# Patient Record
Sex: Female | Born: 1945 | Race: Black or African American | Hispanic: No | Marital: Married | Smoking: Never smoker
Health system: Southern US, Community
[De-identification: ages and names within clinical notes are randomized; demographics above are authoritative.]

## PROBLEM LIST (undated history)

## (undated) DIAGNOSIS — I10 Essential (primary) hypertension: Secondary | ICD-10-CM

## (undated) DIAGNOSIS — IMO0002 Reserved for concepts with insufficient information to code with codable children: Secondary | ICD-10-CM

## (undated) DIAGNOSIS — I4891 Unspecified atrial fibrillation: Secondary | ICD-10-CM

## (undated) DIAGNOSIS — M109 Gout, unspecified: Secondary | ICD-10-CM

## (undated) DIAGNOSIS — M1990 Unspecified osteoarthritis, unspecified site: Secondary | ICD-10-CM

## (undated) DIAGNOSIS — E119 Type 2 diabetes mellitus without complications: Secondary | ICD-10-CM

## (undated) DIAGNOSIS — M329 Systemic lupus erythematosus, unspecified: Secondary | ICD-10-CM

## (undated) HISTORY — PX: LUNG SURGERY: SHX703

## (undated) HISTORY — PX: CHOLECYSTECTOMY: SHX55

## (undated) HISTORY — PX: ABDOMINAL HYSTERECTOMY: SHX81

---

## 2014-12-30 ENCOUNTER — Emergency Department (HOSPITAL_BASED_OUTPATIENT_CLINIC_OR_DEPARTMENT_OTHER)
Admission: EM | Admit: 2014-12-30 | Discharge: 2014-12-31 | Disposition: A | Discharge status: Home / Self Care | Payer: Medicare PPO | Attending: Emergency Medicine | Admitting: Emergency Medicine

## 2014-12-30 ENCOUNTER — Encounter (HOSPITAL_BASED_OUTPATIENT_CLINIC_OR_DEPARTMENT_OTHER): Payer: Self-pay | Admitting: *Deleted

## 2014-12-30 DIAGNOSIS — E119 Type 2 diabetes mellitus without complications: Secondary | ICD-10-CM | POA: Diagnosis not present

## 2014-12-30 DIAGNOSIS — R61 Generalized hyperhidrosis: Secondary | ICD-10-CM | POA: Insufficient documentation

## 2014-12-30 DIAGNOSIS — R197 Diarrhea, unspecified: Secondary | ICD-10-CM | POA: Diagnosis not present

## 2014-12-30 DIAGNOSIS — Z7952 Long term (current) use of systemic steroids: Secondary | ICD-10-CM | POA: Insufficient documentation

## 2014-12-30 DIAGNOSIS — Z7982 Long term (current) use of aspirin: Secondary | ICD-10-CM | POA: Insufficient documentation

## 2014-12-30 DIAGNOSIS — I4891 Unspecified atrial fibrillation: Secondary | ICD-10-CM | POA: Insufficient documentation

## 2014-12-30 DIAGNOSIS — R1110 Vomiting, unspecified: Secondary | ICD-10-CM | POA: Insufficient documentation

## 2014-12-30 DIAGNOSIS — R42 Dizziness and giddiness: Secondary | ICD-10-CM | POA: Diagnosis present

## 2014-12-30 DIAGNOSIS — I10 Essential (primary) hypertension: Secondary | ICD-10-CM | POA: Insufficient documentation

## 2014-12-30 DIAGNOSIS — R531 Weakness: Secondary | ICD-10-CM | POA: Diagnosis not present

## 2014-12-30 DIAGNOSIS — M109 Gout, unspecified: Secondary | ICD-10-CM | POA: Insufficient documentation

## 2014-12-30 DIAGNOSIS — Z79899 Other long term (current) drug therapy: Secondary | ICD-10-CM | POA: Insufficient documentation

## 2014-12-30 DIAGNOSIS — M1990 Unspecified osteoarthritis, unspecified site: Secondary | ICD-10-CM | POA: Insufficient documentation

## 2014-12-30 HISTORY — DX: Essential (primary) hypertension: I10

## 2014-12-30 HISTORY — DX: Gout, unspecified: M10.9

## 2014-12-30 HISTORY — DX: Type 2 diabetes mellitus without complications: E11.9

## 2014-12-30 HISTORY — DX: Unspecified osteoarthritis, unspecified site: M19.90

## 2014-12-30 HISTORY — DX: Unspecified atrial fibrillation: I48.91

## 2014-12-30 LAB — URINALYSIS, ROUTINE W REFLEX MICROSCOPIC
Bilirubin Urine: NEGATIVE
Glucose, UA: NEGATIVE mg/dL
Hgb urine dipstick: NEGATIVE
Ketones, ur: NEGATIVE mg/dL
NITRITE: NEGATIVE
PROTEIN: NEGATIVE mg/dL
Specific Gravity, Urine: 1.011 (ref 1.005–1.030)
Urobilinogen, UA: 0.2 mg/dL (ref 0.0–1.0)
pH: 5.5 (ref 5.0–8.0)

## 2014-12-30 LAB — URINE MICROSCOPIC-ADD ON

## 2014-12-30 LAB — COMPREHENSIVE METABOLIC PANEL
ALT: 19 U/L (ref 0–35)
ANION GAP: 7 (ref 5–15)
AST: 19 U/L (ref 0–37)
Albumin: 3.5 g/dL (ref 3.5–5.2)
Alkaline Phosphatase: 58 U/L (ref 39–117)
BUN: 42 mg/dL — ABNORMAL HIGH (ref 6–23)
CO2: 27 mmol/L (ref 19–32)
CREATININE: 1.55 mg/dL — AB (ref 0.50–1.10)
Calcium: 8.6 mg/dL (ref 8.4–10.5)
Chloride: 103 mmol/L (ref 96–112)
GFR, EST AFRICAN AMERICAN: 39 mL/min — AB (ref >90)
GFR, EST NON AFRICAN AMERICAN: 33 mL/min — AB (ref >90)
GLUCOSE: 162 mg/dL — AB (ref 70–99)
Potassium: 3.4 mmol/L — ABNORMAL LOW (ref 3.5–5.1)
Sodium: 137 mmol/L (ref 135–145)
Total Bilirubin: 0.2 mg/dL — ABNORMAL LOW (ref 0.3–1.2)
Total Protein: 7.8 g/dL (ref 6.0–8.3)

## 2014-12-30 LAB — CBC
HEMATOCRIT: 33.7 % — AB (ref 36.0–46.0)
HEMOGLOBIN: 10.9 g/dL — AB (ref 12.0–15.0)
MCH: 26.3 pg (ref 26.0–34.0)
MCHC: 32.3 g/dL (ref 30.0–36.0)
MCV: 81.4 fL (ref 78.0–100.0)
Platelets: 234 10*3/uL (ref 150–400)
RBC: 4.14 MIL/uL (ref 3.87–5.11)
RDW: 15 % (ref 11.5–15.5)
WBC: 8.2 10*3/uL (ref 4.0–10.5)

## 2014-12-30 LAB — TROPONIN I: Troponin I: <0.03 ng/mL (ref <0.031)

## 2014-12-30 MED ORDER — SODIUM CHLORIDE 0.9 % IV BOLUS (SEPSIS)
1000.0000 mL | Freq: Once | INTRAVENOUS | Status: AC
  Administered 2014-12-30: 1000 mL via INTRAVENOUS

## 2014-12-30 MED ORDER — PROMETHAZINE HCL 25 MG PO TABS
25.0000 mg | ORAL_TABLET | Freq: Four times a day (QID) | ORAL | Status: DC | PRN
Start: 2014-12-30 — End: 2019-09-07

## 2014-12-30 NOTE — ED Notes (Signed)
Pt sts she was in a store 5 days ago when she became dizzy and sweaty. When she got home that day, she began vomiting. Symptoms have been persistent since then.

## 2014-12-30 NOTE — ED Notes (Signed)
MD at bedside. 

## 2014-12-30 NOTE — ED Provider Notes (Signed)
CSN: [id number]     Arrival date & time 12/30/14  2030 History  This chart was scribed for Jerelyn ScottMartha Linker, MD by Roxy Cedarhandni Bhalodia, ED Scribe. This patient was seen in room MH02/MH02 and the patient's care was started at 8:55 PM.   Chief Complaint  Patient presents with  . Dizziness   Patient is a 69 y.o. female presenting with dizziness and vomiting. The history is provided by the patient. No language interpreter was used.  Dizziness Severity:  Moderate Duration:  5 days Timing:  Intermittent Progression:  Waxing and waning Chronicity:  New Relieved by:  Nothing Worsened by:  Nothing Ineffective treatments:  None tried Associated symptoms: diarrhea and vomiting   Emesis Severity:  Moderate Timing:  Intermittent Associated symptoms: diarrhea    HPI Comments: Jody Burton is a 69 y.o. female with a PMHx of atrial fibrillation, arthritis, hypertension, diabetes, gout, abdominal hysterectomy, cholecystectomy, and lung surgery, who presents to the Emergency Department complaining of moderate dizziness and diaphoresis that initially began 5 days ago. Patient states that she was out at a store during onset of symptoms. She states that she had associated onset of vomiting and diarrhea upon returning home from the store. She states that her symptoms have been persistent since onset. She also reports generalized weakness upon ambulation. Patient states that the vomiting has resolved and she denies emesis today. Patient states that drinking water and ice chips in the past few days caused abdominal pain and nausea. She denies trouble drinking water or abdominal pain today. She denies associated CP, SOB, syncope. Patient states that she is currently in town caring for her daughter who has severe lupus. Patient states that she was hoping that her symptoms would improve but came into the ED because her symptoms have been persistent since onset.  Past Medical History  Diagnosis Date  . Atrial  fibrillation   . Arthritis   . Hypertension   . Diabetes mellitus without complication   . Gout    Past Surgical History  Procedure Laterality Date  . Abdominal hysterectomy    . Cholecystectomy    . Lung surgery     No family history on file. History  Substance Use Topics  . Smoking status: Never Smoker   . Smokeless tobacco: Not on file  . Alcohol Use: No   OB History    No data available     Review of Systems  Constitutional: Positive for diaphoresis.  Gastrointestinal: Positive for vomiting and diarrhea.  Neurological: Positive for dizziness.  All other systems reviewed and are negative.  Allergies  Review of patient's allergies indicates no known allergies.  Home Medications   Prior to Admission medications   Medication Sig Start Date End Date Taking? Authorizing Provider  allopurinol (ZYLOPRIM) 100 MG tablet Take 100 mg by mouth daily.   Yes Historical Provider, MD  aspirin 81 MG tablet Take 81 mg by mouth daily.   Yes Historical Provider, MD  carvedilol (COREG) 3.125 MG tablet Take 3.125 mg by mouth 2 (two) times daily with a meal.   Yes Historical Provider, MD  furosemide (LASIX) 20 MG tablet Take 20 mg by mouth.   Yes Historical Provider, MD  glimepiride (AMARYL) 4 MG tablet Take 4 mg by mouth daily with breakfast.   Yes Historical Provider, MD  hydroxychloroquine (PLAQUENIL) 200 MG tablet Take 200 mg by mouth 2 (two) times daily.   Yes Historical Provider, MD  losartan (COZAAR) 100 MG tablet Take 100 mg by mouth daily.  Yes Historical Provider, MD  meclizine (ANTIVERT) 25 MG tablet Take 25 mg by mouth 3 (three) times daily as needed for dizziness.   Yes Historical Provider, MD  predniSONE (DELTASONE) 5 MG tablet Take 5 mg by mouth daily with breakfast.   Yes Historical Provider, MD  promethazine (PHENERGAN) 25 MG tablet Take 1 tablet (25 mg total) by mouth every 6 (six) hours as needed for nausea or vomiting. 12/30/14   Jerelyn Scott, MD   Triage Vitals: BP  145/33 mmHg  Pulse 68  Temp(Src) 99.2 F (37.3 C) (Oral)  Resp 18  Ht  (1.6 m)  Wt 230 lb (104.327 kg)  BMI 40.75 kg/m2  SpO2 97% Vitals reviewed Physical Exam  Physical Examination: General appearance - alert, well appearing, and in no distress Mental status - alert, oriented to person, place, and time Eyes - no conjunctival injection, no scleral icterus Mouth - mucous membranes tacky, OP clear without lesions Chest - clear to auscultation, no wheezes, rales or rhonchi, symmetric air entry Heart - normal rate, regular rhythm, normal S1, S2, no murmurs, rubs, clicks or gallops Abdomen - soft, nontender, nondistended, no masses or organomegaly, nabs Extremities - peripheral pulses normal, no pedal edema, no clubbing or cyanosis Skin - normal coloration and turgor, no rashes  ED Course  Procedures (including critical care time)  DIAGNOSTIC STUDIES: Oxygen Saturation is 97% on RA, normal by my interpretation.    COORDINATION OF CARE: 9:03 PM- Discussed plans to order diagnostic EKG, and lab work. Will give patient IV fluids. Pt advised of plan for treatment and pt agrees.  Labs Review Labs Reviewed  CBC - Abnormal; Notable for the following:    Hemoglobin 10.9 (*)    HCT 33.7 (*)    All other components within normal limits  COMPREHENSIVE METABOLIC PANEL - Abnormal; Notable for the following:    Potassium 3.4 (*)    Glucose, Bld 162 (*)    BUN 42 (*)    Creatinine, Ser 1.55 (*)    Total Bilirubin 0.2 (*)    GFR calc non Af Amer 33 (*)    GFR calc Af Amer 39 (*)    All other components within normal limits  URINALYSIS, ROUTINE W REFLEX MICROSCOPIC - Abnormal; Notable for the following:    Leukocytes, UA MODERATE (*)    All other components within normal limits  URINE MICROSCOPIC-ADD ON - Abnormal; Notable for the following:    Squamous Epithelial / LPF FEW (*)    Bacteria, UA FEW (*)    Casts HYALINE CASTS (*)    All other components within normal limits   CLOSTRIDIUM DIFFICILE BY PCR  STOOL CULTURE  URINE CULTURE  TROPONIN I    Imaging Review No results found.    Date/Time:  Friday December 30 2014 20:45:13 EDT Ventricular Rate:  76 PR Interval:  158 QRS Duration: 148 QT Interval:  430 QTC Calculation: 483 R Axis:   -60 Text Interpretation:  Sinus rhythm with marked sinus arrhythmia with  ventricular escape complexes Right bundle branch block Left anterior  fascicular block  Bifascicular block  Left ventricular hypertrophy  with repolarization abnormality Cannot rule out Septal infarct , age  undetermined Abnormal ECG No old tracing to compare Confirmed by [facility]   MD, MARTHA [phone]) on 12/30/2014 8:48:05 PM MDM   Final diagnoses:  Vomiting and diarrhea  Weakness    Pt presenting with c/o feeling weak in the setting of having vomiting and diarrhea for several days.  No chest pain or palpitations.  No syncope.  Pt feels improved after IV hydration and nausea meds in the ED.  Her labs are reassuring.  She has been able to tolerate po fluids in the ED and is requesting discharge.  Her HR has been intermittently bradycardic, but no signs of heart block- she is on carvedilol so suspect this is a side effect from this med.  She is not orthostatic and upon ambulating to the bathroom she did not feel dizzy any further.  Discharged with strict return precautions.  Pt agreeable with plan.  I personally performed the services described in this documentation, which was scribed in my presence. The recorded information has been reviewed and is accurate.   Jerelyn Scott, MD 12/31/14 [phone]

## 2014-12-30 NOTE — Discharge Instructions (Signed)
Return to the ED with any concerns including fever/chills, vomiting and not able to keep down liquids, chest pain, difficulty breathing, fainting, decreased level of alertness/lethargy, or any other alarming symptoms

## 2015-01-01 LAB — URINE CULTURE: Colony Count: 4000

## 2019-07-30 ENCOUNTER — Emergency Department (INDEPENDENT_AMBULATORY_CARE_PROVIDER_SITE_OTHER): Payer: Medicare Other

## 2019-07-30 ENCOUNTER — Encounter: Payer: Self-pay | Admitting: Emergency Medicine

## 2019-07-30 ENCOUNTER — Emergency Department (INDEPENDENT_AMBULATORY_CARE_PROVIDER_SITE_OTHER)
Admission: EM | Admit: 2019-07-30 | Discharge: 2019-07-30 | Disposition: A | Discharge status: Home / Self Care | Payer: Medicare Other | Source: Home / Self Care

## 2019-07-30 ENCOUNTER — Other Ambulatory Visit: Payer: Self-pay

## 2019-07-30 DIAGNOSIS — R062 Wheezing: Secondary | ICD-10-CM

## 2019-07-30 DIAGNOSIS — J189 Pneumonia, unspecified organism: Secondary | ICD-10-CM

## 2019-07-30 DIAGNOSIS — R05 Cough: Secondary | ICD-10-CM

## 2019-07-30 HISTORY — DX: Reserved for concepts with insufficient information to code with codable children: IMO0002

## 2019-07-30 HISTORY — DX: Systemic lupus erythematosus, unspecified: M32.9

## 2019-07-30 MED ORDER — DEXAMETHASONE SODIUM PHOSPHATE 10 MG/ML IJ SOLN
10.0000 mg | Freq: Once | INTRAMUSCULAR | Status: AC
  Administered 2019-07-30: 10 mg via INTRAMUSCULAR

## 2019-07-30 MED ORDER — ALBUTEROL SULFATE HFA 108 (90 BASE) MCG/ACT IN AERS: 1.0000 | INHALATION_SPRAY | Freq: Four times a day (QID) | RESPIRATORY_TRACT | 0 refills | Status: DC | PRN

## 2019-07-30 MED ORDER — CEFTRIAXONE SODIUM 1 G IJ SOLR
1.0000 g | Freq: Once | INTRAMUSCULAR | Status: AC
  Administered 2019-07-30: 1 g via INTRAMUSCULAR

## 2019-07-30 MED ORDER — AZITHROMYCIN 250 MG PO TABS: 250.0000 mg | ORAL_TABLET | Freq: Every day | ORAL | 0 refills | Status: DC

## 2019-07-30 NOTE — Discharge Instructions (Signed)
°  Please take antibiotics as prescribed and be sure to complete entire course even if you start to feel better to ensure infection does not come back.  Please follow up with your family doctor next week as previously scheduled.  Call 911 or go to the hospital if symptoms worsening.

## 2019-07-30 NOTE — ED Triage Notes (Signed)
Patient reports dry cough with some wheezing sounds starting 2 days ago; no fever; this feels similar to when she had bronchitis and had to be hospitalized a couple years ago.

## 2019-07-30 NOTE — ED Provider Notes (Signed)
[facility]    CSN: [id number] Arrival date & time: 07/30/19  1749      History   Chief Complaint Chief Complaint  Patient presents with  . Cough  . Wheezing    HPI Jody Burton is a 73 y.o. female.   HPI  Jody Burton is a 73 y.o. female presenting to UC with c/o 2 days of worsening dry cough with wheezing, mild chest tightness, mild nasal congestion.  Mild SOB with exertion. Symptoms feel similar to when she had to be hospitalized a few years ago for bronchitis but symptoms do not feel as severe as she had audible wheezing last time. Denies fever, chills, n/v/d. No sick contacts or recent travel. Denies concern for Covid or flu.   Past Medical History:  Diagnosis Date  . Arthritis   . Atrial fibrillation (HCC)   . Diabetes mellitus without complication (HCC)   . Gout   . Hypertension   . Lupus (HCC)     There are no active problems to display for this patient.   Past Surgical History:  Procedure Laterality Date  . ABDOMINAL HYSTERECTOMY    . CHOLECYSTECTOMY    . LUNG SURGERY      OB History   No obstetric history on file.      Home Medications    Prior to Admission medications   Medication Sig Start Date End Date Taking? Authorizing Provider  metFORMIN (GLUCOPHAGE) 1000 MG tablet Take 1,000 mg by mouth daily with breakfast.   Yes [provider]  albuterol (VENTOLIN HFA) 108 (90 Base) MCG/ACT inhaler Inhale 1-2 puffs into the lungs every 6 (six) hours as needed for wheezing or shortness of breath. 07/30/19   Lurene Shadow, PA-C  allopurinol (ZYLOPRIM) 100 MG tablet Take 100 mg by mouth daily.    [provider]  aspirin 81 MG tablet Take 81 mg by mouth daily.    [provider]  azithromycin (ZITHROMAX) 250 MG tablet Take 1 tablet (250 mg total) by mouth daily. Take first 2 tablets together, then 1 every day until finished. 07/30/19   Lurene Shadow, PA-C  carvedilol (COREG) 3.125 MG tablet Take 3.125 mg by  mouth 2 (two) times daily with a meal.    [provider]  furosemide (LASIX) 20 MG tablet Take 20 mg by mouth.    [provider]  glimepiride (AMARYL) 4 MG tablet Take 4 mg by mouth daily with breakfast.    [provider]  hydroxychloroquine (PLAQUENIL) 200 MG tablet Take 200 mg by mouth 2 (two) times daily.    [provider]  losartan (COZAAR) 100 MG tablet Take 100 mg by mouth daily.    [provider]  meclizine (ANTIVERT) 25 MG tablet Take 25 mg by mouth 3 (three) times daily as needed for dizziness.    [provider]  predniSONE (DELTASONE) 5 MG tablet Take 5 mg by mouth daily with breakfast.    [provider]  promethazine (PHENERGAN) 25 MG tablet Take 1 tablet (25 mg total) by mouth every 6 (six) hours as needed for nausea or vomiting. 12/30/14   Mabe, Latanya Maudlin, MD    Family History No family history on file.  Social History Social History   Tobacco Use  . Smoking status: Never Smoker  . Smokeless tobacco: Never Used  Substance Use Topics  . Alcohol use: No  . Drug use: No     Allergies   Patient has no known allergies.  Review of Systems Review of Systems  Constitutional: Negative for chills and fever.  HENT: Positive for congestion. Negative for ear pain, sore throat, trouble swallowing and voice change.   Respiratory: Positive for cough, chest tightness and shortness of breath (mild).   Cardiovascular: Negative for chest pain and palpitations.  Gastrointestinal: Negative for abdominal pain, diarrhea, nausea and vomiting.  Musculoskeletal: Negative for arthralgias, back pain and myalgias.  Skin: Negative for rash.  Neurological: Negative for dizziness, light-headedness and headaches.     Physical Exam Triage Vital Signs ED Triage Vitals  Enc Vitals Group     BP 07/30/19 1806 (!) 154/83     Pulse Rate 07/30/19 1806 89     Resp 07/30/19 1806 16     Temp 07/30/19 1806 98.4 F (36.9 C)     Temp  Source 07/30/19 1806 Oral     SpO2 07/30/19 1806 100 %     Weight 07/30/19 1809 214 lb (97.1 kg)     Height 07/30/19 1809 5\' 3"  (1.6 m)     Head Circumference --      Peak Flow --      Pain Score 07/30/19 1809 0     Pain Loc --      Pain Edu? --      Excl. in GC? --    No data found.  Updated Vital Signs BP (!) 154/83 (BP Location: Right Arm)   Pulse 89   Temp 98.4 F (36.9 C) (Oral)   Resp 16   Ht 5\' 3"  (1.6 m)   Wt 214 lb (97.1 kg)   SpO2 100%   BMI 37.91 kg/m   Visual Acuity Right Eye Distance:   Left Eye Distance:   Bilateral Distance:    Right Eye Near:   Left Eye Near:    Bilateral Near:     Physical Exam Vitals signs and nursing note reviewed.  Constitutional:      Appearance: Normal appearance. She is well-developed.  HENT:     Head: Normocephalic and atraumatic.     Right Ear: Tympanic membrane normal.     Left Ear: Tympanic membrane normal.     Nose: Nose normal.     Right Sinus: No maxillary sinus tenderness or frontal sinus tenderness.     Left Sinus: No maxillary sinus tenderness or frontal sinus tenderness.     Mouth/Throat:     Lips: Pink.     Mouth: Mucous membranes are moist.     Pharynx: Oropharynx is clear. Uvula midline.  Neck:     Musculoskeletal: Normal range of motion.  Cardiovascular:     Rate and Rhythm: Normal rate and regular rhythm.  Pulmonary:     Effort: Pulmonary effort is normal. No respiratory distress.     Breath sounds: No stridor. Wheezing and rhonchi present. No rales.     Comments: Diffuse wheeze and rhonchi. Able to speak in full sentences. Musculoskeletal: Normal range of motion.  Skin:    General: Skin is warm and dry.  Neurological:     Mental Status: She is alert and oriented to person, place, and time.  Psychiatric:        Behavior: Behavior normal.      UC Treatments / Results  Labs (all labs ordered are listed, but only abnormal results are displayed) Labs Reviewed - No data to display  EKG    Radiology Dg Chest 2 View  Result Date: 07/30/2019 CLINICAL DATA:  Cough, wheeze EXAM: CHEST - 2 VIEW COMPARISON:  None. FINDINGS: Hazy opacities most pronounced in the right lung base. No pneumothorax or effusion. The aorta is calcified. The remaining cardiomediastinal contours are unremarkable. Degenerative changes are present in the imaged spine and shoulders. No acute osseous or soft tissue abnormality. IMPRESSION: Focal hazy opacity in the right lung base could reflect atelectasis or early infection. Electronically Signed   By: Lovena Le M.D.   On: 07/30/2019 18:50    Procedures Procedures (including critical care time)  Medications Ordered in UC Medications  cefTRIAXone (ROCEPHIN) injection 1 g (1 g Intramuscular Given 07/30/19 1905)  dexamethasone (DECADRON) injection 10 mg (10 mg Intramuscular Given 07/30/19 1911)    Initial Impression / Assessment and Plan / UC Course  I have reviewed the triage vital signs and the nursing notes.  Pertinent labs & imaging results that were available during my care of the patient were reviewed by me and considered in my medical decision making (see chart for details).     Reviewed imaging with pt. Will tx as early pneumonia Rocephin and decadron given in UC AVS provided  Final Clinical Impressions(s) / UC Diagnoses   Final diagnoses:  Community acquired pneumonia of right lower lobe of lung     Discharge Instructions      Please take antibiotics as prescribed and be sure to complete entire course even if you start to feel better to ensure infection does not come back.  Please follow up with your family doctor next week as previously scheduled.  Call 911 or go to the hospital if symptoms worsening.    ED Prescriptions    Medication Sig Dispense Auth. Provider   azithromycin (ZITHROMAX) 250 MG tablet Take 1 tablet (250 mg total) by mouth daily. Take first 2 tablets together, then 1 every day until finished. 6 tablet Gerarda Fraction,  Carmita Boom O, PA-C   albuterol (VENTOLIN HFA) 108 (90 Base) MCG/ACT inhaler Inhale 1-2 puffs into the lungs every 6 (six) hours as needed for wheezing or shortness of breath. 18 g Noe Gens, PA-C     PDMP not reviewed this encounter.   Noe Gens, PA-C 07/31/19 1309

## 2019-09-07 ENCOUNTER — Encounter: Payer: Self-pay | Admitting: Emergency Medicine

## 2019-09-07 ENCOUNTER — Emergency Department (INDEPENDENT_AMBULATORY_CARE_PROVIDER_SITE_OTHER)
Admission: EM | Admit: 2019-09-07 | Discharge: 2019-09-07 | Disposition: A | Discharge status: Home / Self Care | Payer: Medicare Other | Source: Home / Self Care

## 2019-09-07 ENCOUNTER — Other Ambulatory Visit: Payer: Self-pay

## 2019-09-07 DIAGNOSIS — L0291 Cutaneous abscess, unspecified: Secondary | ICD-10-CM

## 2019-09-07 MED ORDER — SULFAMETHOXAZOLE-TRIMETHOPRIM 800-160 MG PO TABS: 1.0000 | ORAL_TABLET | Freq: Two times a day (BID) | ORAL | 0 refills | Status: DC

## 2019-09-07 MED ORDER — HYDROCODONE-ACETAMINOPHEN 5-325 MG PO TABS: 1.0000 | ORAL_TABLET | ORAL | 0 refills | Status: AC | PRN

## 2019-09-07 NOTE — ED Triage Notes (Signed)
Cellulitis left abdomen x 4 days, draining

## 2019-09-07 NOTE — Discharge Instructions (Addendum)
Return if any problems.   Return in 2 days for recheck and packing removal

## 2019-09-07 NOTE — ED Provider Notes (Signed)
[facility]    CSN: [id number] Arrival date & time: 09/07/19  1630      History   Chief Complaint Chief Complaint  Patient presents with  . Cellulitis    HPI Jody Burton is a 73 y.o. female.   The history is provided by the patient. No language interpreter was used.  Abscess Location:  Torso Torso abscess location:  Abd LLQ Size:  2 Abscess quality: painful, redness and warmth   Red streaking: no   Progression:  Worsening Pain details:    Quality:  No pain   Progression:  Worsening Chronicity:  New Context: diabetes   Relieved by:  Nothing Worsened by:  Nothing Ineffective treatments:  None tried   Past Medical History:  Diagnosis Date  . Arthritis   . Atrial fibrillation (HCC)   . Diabetes mellitus without complication (HCC)   . Gout   . Hypertension   . Lupus (HCC)     There are no active problems to display for this patient.   Past Surgical History:  Procedure Laterality Date  . ABDOMINAL HYSTERECTOMY    . CHOLECYSTECTOMY    . LUNG SURGERY      OB History   No obstetric history on file.      Home Medications    Prior to Admission medications   Medication Sig Start Date End Date Taking? Authorizing Provider  albuterol (VENTOLIN HFA) 108 (90 Base) MCG/ACT inhaler Inhale 1-2 puffs into the lungs every 6 (six) hours as needed for wheezing or shortness of breath. 07/30/19   Lurene Shadow, PA-C  allopurinol (ZYLOPRIM) 100 MG tablet Take 100 mg by mouth daily.    [provider]  aspirin 81 MG tablet Take 81 mg by mouth daily.    [provider]  carvedilol (COREG) 3.125 MG tablet Take 3.125 mg by mouth 2 (two) times daily with a meal.    [provider]  furosemide (LASIX) 20 MG tablet Take 20 mg by mouth.    [provider]  glimepiride (AMARYL) 4 MG tablet Take 4 mg by mouth daily with breakfast.    [provider]  HYDROcodone-acetaminophen (NORCO/VICODIN) 5-325 MG tablet Take 1  tablet by mouth every 4 (four) hours as needed for moderate pain. 09/07/19 09/06/20  Elson Areas, PA-C  hydroxychloroquine (PLAQUENIL) 200 MG tablet Take 200 mg by mouth 2 (two) times daily.    [provider]  losartan (COZAAR) 100 MG tablet Take 100 mg by mouth daily.    [provider]  metFORMIN (GLUCOPHAGE) 1000 MG tablet Take 1,000 mg by mouth daily with breakfast.    [provider]  sulfamethoxazole-trimethoprim (BACTRIM DS) 800-160 MG tablet Take 1 tablet by mouth 2 (two) times daily. 09/07/19   Elson Areas, PA-C    Family History Family History  Problem Relation Age of Onset  . Hypertension Mother   . Heart failure Mother   . Stroke Mother   . COPD Father   . Heart failure Father     Social History Social History   Tobacco Use  . Smoking status: Never Smoker  . Smokeless tobacco: Never Used  Substance Use Topics  . Alcohol use: No  . Drug use: No     Allergies   Patient has no known allergies.   Review of Systems Review of Systems  All other systems reviewed and are negative.    Physical Exam Triage Vital Signs ED Triage Vitals  Enc Vitals Group  BP 09/07/19 1750 (!) 168/93     Pulse Rate 09/07/19 1750 75     Resp --      Temp 09/07/19 1750 99.1 F (37.3 C)     Temp Source 09/07/19 1750 Oral     SpO2 09/07/19 1750 99 %     Weight 09/07/19 1752 218 lb (98.9 kg)     Height 09/07/19 1752 5\' 3"  (1.6 m)     Head Circumference --      Peak Flow --      Pain Score 09/07/19 1751 9     Pain Loc --      Pain Edu? --      Excl. in GC? --    No data found.  Updated Vital Signs BP (!) 168/93 (BP Location: Right Arm)   Pulse 75   Temp 99.1 F (37.3 C) (Oral)   Ht 5\' 3"  (1.6 m)   Wt 98.9 kg   SpO2 99%   BMI 38.62 kg/m   Visual Acuity Right Eye Distance:   Left Eye Distance:   Bilateral Distance:    Right Eye Near:   Left Eye Near:    Bilateral Near:     Physical Exam Vitals signs and nursing note reviewed.   Constitutional:      Appearance: She is well-developed.  HENT:     Head: Normocephalic.  Neck:     Musculoskeletal: Normal range of motion.  Cardiovascular:     Rate and Rhythm: Normal rate.     Pulses: Normal pulses.  Pulmonary:     Effort: Pulmonary effort is normal.  Abdominal:     General: There is no distension.     Comments: 2cm swollen area llq abdomen   Musculoskeletal: Normal range of motion.  Neurological:     Mental Status: She is alert and oriented to person, place, and time.      UC Treatments / Results  Labs (all labs ordered are listed, but only abnormal results are displayed) Labs Reviewed - No data to display  EKG   Radiology No results found.  Procedures Incision and Drainage  Date/Time: 09/07/2019 7:48 PM Performed by: Elson AreasSofia, Leslie K, PA-C Authorized by: Elson AreasSofia, Leslie K, PA-C   Consent:    Consent obtained:  Verbal   Consent given by:  Patient   Risks discussed:  Bleeding, incomplete drainage, pain and damage to other organs   Alternatives discussed:  No treatment Universal protocol:    Procedure explained and questions answered to patient or proxy's satisfaction: yes     Relevant documents present and verified: yes     Test results available and properly labeled: yes     Imaging studies available: yes     Required blood products, implants, devices, and special equipment available: yes     Site/side marked: yes     Immediately prior to procedure a time out was called: yes     Patient identity confirmed:  Verbally with patient Location:    Type:  Abscess   Size:  2 Pre-procedure details:    Skin preparation:  Betadine Anesthesia (see MAR for exact dosages):    Anesthesia method:  Local infiltration   Local anesthetic:  Lidocaine 1% WITH epi and lidocaine 2% w/o epi Procedure type:    Complexity:  Complex Procedure details:    Incision types:  Single straight   Incision depth:  Subcutaneous   Scalpel blade:  11   Wound management:   Probed and deloculated, irrigated with saline  and extensive cleaning   Drainage:  Purulent   Drainage amount:  Moderate   Packing materials:  1/4 in gauze Post-procedure details:    Patient tolerance of procedure:  Tolerated well, no immediate complications   (including critical care time)  Medications Ordered in UC Medications - No data to display  Initial Impression / Assessment and Plan / UC Course  I have reviewed the triage vital signs and the nursing notes.  Pertinent labs & imaging results that were available during my care of the patient were reviewed by me and considered in my medical decision making (see chart for details).     MDM Pt advised to follow up with  Final Clinical Impressions(s) / UC Diagnoses   Final diagnoses:  Cutaneous abscess, unspecified site     Discharge Instructions     Return if any problems.   Return in 2 days for recheck and packing removal    ED Prescriptions    Medication Sig Dispense Auth. Provider   sulfamethoxazole-trimethoprim (BACTRIM DS) 800-160 MG tablet Take 1 tablet by mouth 2 (two) times daily. 20 tablet Sofia, Leslie K, [place]   HYDROcodone-acetaminophen (NORCO/VICODIN) 5-325 MG tablet Take 1 tablet by mouth every 4 (four) hours as needed for moderate pain. 12 tablet Fransico Meadow, [place]     I have reviewed the PDMP during this encounter.  An After Visit Summary was printed and given to the patient. Return in 2 days for packing removal    Fransico Meadow, [place] 09/07/19 1952

## 2019-09-09 ENCOUNTER — Emergency Department (INDEPENDENT_AMBULATORY_CARE_PROVIDER_SITE_OTHER)
Admission: EM | Admit: 2019-09-09 | Discharge: 2019-09-09 | Disposition: A | Discharge status: Home / Self Care | Payer: Medicare Other | Source: Home / Self Care | Attending: Family Medicine | Admitting: Family Medicine

## 2019-09-09 ENCOUNTER — Other Ambulatory Visit: Payer: Self-pay

## 2019-09-09 DIAGNOSIS — Z5189 Encounter for other specified aftercare: Secondary | ICD-10-CM

## 2019-09-09 NOTE — Discharge Instructions (Addendum)
Change dressing daily until healed. Keep wound clean and dry.  Return for any signs of infection (or follow-up with family doctor):  Increasing redness, swelling, pain, heat, drainage, etc.  May apply heating pad once or twice daily.  Finish antibiotic

## 2019-09-09 NOTE — ED Triage Notes (Signed)
Pt was here two days ago for I&D on lower abdomen. Here for recheck and packing removal. Currently on bactrim.

## 2019-09-09 NOTE — ED Provider Notes (Signed)
[facility]    CSN: [id number] Arrival date & time: 09/09/19  1552      History   Chief Complaint Chief Complaint  Patient presents with  . Wound Check    HPI Stephanye Finnicum is a 73 y.o. female.   Patient returns for wound check after I and D abscess abdomen two days ago.  She reports significant decrease in pain.  She denies fevers, chills, and sweats.  The history is provided by the patient.    Past Medical History:  Diagnosis Date  . Arthritis   . Atrial fibrillation ([place])   . Diabetes mellitus without complication ([place])   . Gout   . Hypertension   . Lupus ([place])     There are no problems to display for this patient.   Past Surgical History:  Procedure Laterality Date  . ABDOMINAL HYSTERECTOMY    . CHOLECYSTECTOMY    . LUNG SURGERY      OB History   No obstetric history on file.      Home Medications    Prior to Admission medications   Medication Sig Start Date End Date Taking? Authorizing Provider  albuterol (VENTOLIN HFA) 108 (90 Base) MCG/ACT inhaler Inhale 1-2 puffs into the lungs every 6 (six) hours as needed for wheezing or shortness of breath. 07/30/19   Noe Gens, PA-C  allopurinol (ZYLOPRIM) 100 MG tablet Take 100 mg by mouth daily.    [provider]  aspirin 81 MG tablet Take 81 mg by mouth daily.    [provider]  carvedilol (COREG) 3.125 MG tablet Take 3.125 mg by mouth 2 (two) times daily with a meal.    [provider]  furosemide (LASIX) 20 MG tablet Take 20 mg by mouth.    [provider]  glimepiride (AMARYL) 4 MG tablet Take 4 mg by mouth daily with breakfast.    [provider]  HYDROcodone-acetaminophen (NORCO/VICODIN) 5-325 MG tablet Take 1 tablet by mouth every 4 (four) hours as needed for moderate pain. 09/07/19 09/06/20  Fransico Meadow, PA-C  hydroxychloroquine (PLAQUENIL) 200 MG tablet Take 200 mg by mouth 2 (two) times daily.    [provider]  losartan  (COZAAR) 100 MG tablet Take 100 mg by mouth daily.    [provider]  metFORMIN (GLUCOPHAGE) 1000 MG tablet Take 1,000 mg by mouth daily with breakfast.    [provider]  sulfamethoxazole-trimethoprim (BACTRIM DS) 800-160 MG tablet Take 1 tablet by mouth 2 (two) times daily. 09/07/19   Fransico Meadow, PA-C    Family History Family History  Problem Relation Age of Onset  . Hypertension Mother   . Heart failure Mother   . Stroke Mother   . COPD Father   . Heart failure Father     Social History Social History   Tobacco Use  . Smoking status: Never Smoker  . Smokeless tobacco: Never Used  Substance Use Topics  . Alcohol use: No  . Drug use: No     Allergies   Patient has no known allergies.   Review of Systems Review of Systems  Constitutional: Negative for chills, diaphoresis, fatigue and fever.  Gastrointestinal: Negative for abdominal pain.  Skin: Negative for color change.  All other systems reviewed and are negative.    Physical Exam Triage Vital Signs ED Triage Vitals  Enc Vitals Group     BP 09/09/19 1641 (!) 146/78     Pulse Rate 09/09/19 1641 79  Resp 09/09/19 1641 16     Temp 09/09/19 1641 98.7 F (37.1 C)     Temp Source 09/09/19 1641 Oral     SpO2 09/09/19 1641 100 %     Weight 09/09/19 1642 216 lb 0.8 oz (98 kg)     Height 09/09/19 1642 5\' 3"  (1.6 m)     Head Circumference --      Peak Flow --      Pain Score 09/09/19 1641 6     Pain Loc --      Pain Edu? --      Excl. in [place]? --    No data found.  Updated Vital Signs BP (!) 146/78 (BP Location: Left Arm)   Pulse 79   Temp 98.7 F (37.1 C) (Oral)   Resp 16   Ht 5\' 3"  (1.6 m)   Wt 98 kg   SpO2 100%   BMI 38.27 kg/m   Visual Acuity Right Eye Distance:   Left Eye Distance:   Bilateral Distance:    Right Eye Near:   Left Eye Near:    Bilateral Near:     Physical Exam Vitals and nursing note reviewed.  Constitutional:      General: She is not in acute  distress. Eyes:     Pupils: Pupils are equal, round, and reactive to light.  Cardiovascular:     Rate and Rhythm: Normal rate.  Pulmonary:     Effort: Pulmonary effort is normal.  Abdominal:     Palpations: Abdomen is soft.     Tenderness: There is no abdominal tenderness.       Comments: Packing removed from I and D site left abdomen.  Minimal surrounding erythema and tenderness to palpation.  Wound 38mm diameter, and 51mm deep.  No purulent drainage.  Good granulation tissue.  Additional packing not indicated.  Sterile bandage applied  Neurological:     Mental Status: She is alert.      UC Treatments / Results  Labs (all labs ordered are listed, but only abnormal results are displayed) Labs Reviewed - No data to display  EKG   Radiology No results found.  Procedures Procedures (including critical care time)  Medications Ordered in UC Medications - No data to display  Initial Impression / Assessment and Plan / UC Course  I have reviewed the triage vital signs and the nursing notes.  Pertinent labs & imaging results that were available during my care of the patient were reviewed by me and considered in my medical decision making (see chart for details).    Wound healing well; cellulitis resolving. Return for worsening symptoms.   Final Clinical Impressions(s) / UC Diagnoses   Final diagnoses:  Visit for wound check     Discharge Instructions     Change dressing daily until healed. Keep wound clean and dry.  Return for any signs of infection (or follow-up with family doctor):  Increasing redness, swelling, pain, heat, drainage, etc.  May apply heating pad once or twice daily.  Finish antibiotic       ED Prescriptions    None        11m, MD 09/09/19 [phone]

## 2020-01-05 ENCOUNTER — Emergency Department (INDEPENDENT_AMBULATORY_CARE_PROVIDER_SITE_OTHER): Payer: Medicare Other

## 2020-01-05 ENCOUNTER — Emergency Department
Admission: EM | Admit: 2020-01-05 | Discharge: 2020-01-05 | Disposition: A | Discharge status: Home / Self Care | Payer: Medicare Other | Source: Home / Self Care

## 2020-01-05 ENCOUNTER — Other Ambulatory Visit: Payer: Self-pay

## 2020-01-05 DIAGNOSIS — R0602 Shortness of breath: Secondary | ICD-10-CM

## 2020-01-05 DIAGNOSIS — R0789 Other chest pain: Secondary | ICD-10-CM

## 2020-01-05 DIAGNOSIS — R05 Cough: Secondary | ICD-10-CM

## 2020-01-05 DIAGNOSIS — R062 Wheezing: Secondary | ICD-10-CM

## 2020-01-05 DIAGNOSIS — R059 Cough, unspecified: Secondary | ICD-10-CM

## 2020-01-05 MED ORDER — DEXAMETHASONE SODIUM PHOSPHATE 10 MG/ML IJ SOLN
10.0000 mg | Freq: Once | INTRAMUSCULAR | Status: AC
  Administered 2020-01-05: 10 mg via INTRAMUSCULAR

## 2020-01-05 MED ORDER — ALBUTEROL SULFATE HFA 108 (90 BASE) MCG/ACT IN AERS: 1.0000 | INHALATION_SPRAY | Freq: Four times a day (QID) | RESPIRATORY_TRACT | 0 refills | Status: AC | PRN

## 2020-01-05 NOTE — ED Triage Notes (Signed)
Patient presents to Urgent Care with complaints of wheezing and chest tightness since about 3 weeks ago. Patient reports her sx are worse w/ exertion. Pt states the last time this happened she had pneumonia.

## 2020-01-05 NOTE — ED Provider Notes (Signed)
[facility]    CSN: [id number] Arrival date & time: 01/05/20  1451      History   Chief Complaint Chief Complaint  Patient presents with  . Wheezing    HPI Jody Burton is a 74 y.o. female.   HPI  Jody Burton is a 74 y.o. female presenting to UC with c/o wheeze and chest tightness for about 3 weeks.  Symptoms are worse with exertion.  Last time she had similar symptoms, she was dx with pneumonia. Denies fever, chills, n/v/d. No known sick contacts. She is not concerned for Covid as she reports "not going out" but does not mind being tested if recommended.    Past Medical History:  Diagnosis Date  . Arthritis   . Atrial fibrillation ([facility])   . Diabetes mellitus without complication ([facility])   . Gout   . Hypertension   . Lupus ([facility])     There are no problems to display for this patient.   Past Surgical History:  Procedure Laterality Date  . ABDOMINAL HYSTERECTOMY    . CHOLECYSTECTOMY    . LUNG SURGERY      OB History   No obstetric history on file.      Home Medications    Prior to Admission medications   Medication Sig Start Date End Date Taking? Authorizing Provider  albuterol (VENTOLIN HFA) 108 (90 Base) MCG/ACT inhaler Inhale 1-2 puffs into the lungs every 6 (six) hours as needed for wheezing or shortness of breath. 01/05/20   Lurene Shadow, PA-C  allopurinol (ZYLOPRIM) 100 MG tablet Take 100 mg by mouth daily.    [provider]  aspirin 81 MG tablet Take 81 mg by mouth daily.    [provider]  carvedilol (COREG) 3.125 MG tablet Take 3.125 mg by mouth 2 (two) times daily with a meal.    [provider]  furosemide (LASIX) 20 MG tablet Take 20 mg by mouth.    [provider]  glimepiride (AMARYL) 4 MG tablet Take 4 mg by mouth daily with breakfast.    [provider]  HYDROcodone-acetaminophen (NORCO/VICODIN) 5-325 MG tablet Take 1 tablet by mouth every 4 (four) hours as needed for moderate pain.  09/07/19 09/06/20  Elson Areas, PA-C  hydroxychloroquine (PLAQUENIL) 200 MG tablet Take 200 mg by mouth 2 (two) times daily.    [provider]  losartan (COZAAR) 100 MG tablet Take 100 mg by mouth daily.    [provider]  metFORMIN (GLUCOPHAGE) 1000 MG tablet Take 1,000 mg by mouth daily with breakfast.    [provider]  sulfamethoxazole-trimethoprim (BACTRIM DS) 800-160 MG tablet Take 1 tablet by mouth 2 (two) times daily. 09/07/19   Elson Areas, PA-C    Family History Family History  Problem Relation Age of Onset  . Hypertension Mother   . Heart failure Mother   . Stroke Mother   . COPD Father   . Heart failure Father     Social History Social History   Tobacco Use  . Smoking status: Never Smoker  . Smokeless tobacco: Never Used  Substance Use Topics  . Alcohol use: No  . Drug use: No     Allergies   Patient has no known allergies.   Review of Systems Review of Systems  Constitutional: Negative for chills and fever.  HENT: Negative for congestion, ear pain, sore throat, trouble swallowing and voice change.   Respiratory: Positive for cough, chest tightness, shortness of breath and  wheezing.   Cardiovascular: Negative for chest pain and palpitations.  Gastrointestinal: Negative for abdominal pain, diarrhea, nausea and vomiting.  Musculoskeletal: Negative for arthralgias, back pain and myalgias.  Skin: Negative for rash.  All other systems reviewed and are negative.    Physical Exam Triage Vital Signs ED Triage Vitals  Enc Vitals Group     BP 01/05/20 1508 (!) 157/89     Pulse Rate 01/05/20 1508 76     Resp 01/05/20 1508 18     Temp 01/05/20 1508 98.4 F (36.9 C)     Temp Source 01/05/20 1508 Oral     SpO2 01/05/20 1508 96 %     Weight --      Height --      Head Circumference --      Peak Flow --      Pain Score 01/05/20 1506 0     Pain Loc --      Pain Edu? --      Excl. in GC? --    No data found.  Updated  Vital Signs BP (!) 157/89 (BP Location: Right Arm)   Pulse 76   Temp 98.4 F (36.9 C) (Oral)   Resp 18   SpO2 98%   Visual Acuity Right Eye Distance:   Left Eye Distance:   Bilateral Distance:    Right Eye Near:   Left Eye Near:    Bilateral Near:     Physical Exam Vitals and nursing note reviewed.  Constitutional:      General: She is not in acute distress.    Appearance: Normal appearance. She is well-developed. She is not ill-appearing, toxic-appearing or diaphoretic.  HENT:     Head: Normocephalic and atraumatic.     Right Ear: Tympanic membrane and ear canal normal.     Left Ear: Tympanic membrane and ear canal normal.     Nose: Nose normal.     Mouth/Throat:     Lips: Pink.     Mouth: Mucous membranes are moist.     Pharynx: Oropharynx is clear. Uvula midline.  Cardiovascular:     Rate and Rhythm: Normal rate and regular rhythm.  Pulmonary:     Effort: Pulmonary effort is normal. No respiratory distress.     Breath sounds: No stridor. Wheezing present. No rhonchi or rales.  Musculoskeletal:        General: Normal range of motion.     Cervical back: Normal range of motion.  Skin:    General: Skin is warm and dry.  Neurological:     Mental Status: She is alert and oriented to person, place, and time.  Psychiatric:        Behavior: Behavior normal.      UC Treatments / Results  Labs (all labs ordered are listed, but only abnormal results are displayed) Labs Reviewed  NOVEL CORONAVIRUS, NAA    EKG   Radiology DG Chest 2 View  Result Date: 01/05/2020 CLINICAL DATA:  Shortness of breath, chest tightness EXAM: CHEST - 2 VIEW COMPARISON:  07/30/2019 FINDINGS: The heart size and mediastinal contours are stable Atherosclerotic calcification of the aortic knob. No focal airspace consolidation, pleural effusion, or pneumothorax. The visualized skeletal structures are unremarkable. IMPRESSION: No active cardiopulmonary disease. Electronically Signed   By:  Duanne Guess D.O.   On: 01/05/2020 15:42    Procedures Procedures (including critical care time)  Medications Ordered in UC Medications  dexamethasone (DECADRON) injection 10 mg (10 mg Intramuscular Given 01/05/20 1554)  Initial Impression / Assessment and Plan / UC Course  I have reviewed the triage vital signs and the nursing notes.  Pertinent labs & imaging results that were available during my care of the patient were reviewed by me and considered in my medical decision making (see chart for details).     Reviewed with pt Reassured pt no antibiotics indicated at this time Will tx symptomatically AVS provided  Final Clinical Impressions(s) / UC Diagnoses   Final diagnoses:  Cough  Chest tightness  Wheeze     Discharge Instructions      Please follow up with your family doctor later this week if not improving.  Call 911 or have someone drive you to the hospital if you develop worsening trouble breathing, dizziness, chest pain, or other new concerning symptoms develop.    ED Prescriptions    Medication Sig Dispense Auth. Provider   albuterol (VENTOLIN HFA) 108 (90 Base) MCG/ACT inhaler Inhale 1-2 puffs into the lungs every 6 (six) hours as needed for wheezing or shortness of breath. 18 g Noe Gens, PA-C     PDMP not reviewed this encounter.   Noe Gens, [place] 01/06/20 [phone]

## 2020-01-05 NOTE — Discharge Instructions (Signed)
  Please follow up with your family doctor later this week if not improving.  Call 911 or have someone drive you to the hospital if you develop worsening trouble breathing, dizziness, chest pain, or other new concerning symptoms develop.

## 2020-01-06 LAB — NOVEL CORONAVIRUS, NAA: SARS-CoV-2, NAA: NOT DETECTED

## 2020-01-06 LAB — SARS-COV-2, NAA 2 DAY TAT

## 2022-01-11 ENCOUNTER — Emergency Department
Admission: EM | Admit: 2022-01-11 | Discharge: 2022-01-11 | Disposition: A | Discharge status: Home / Self Care | Payer: Medicare HMO | Source: Home / Self Care | Attending: Family Medicine | Admitting: Family Medicine

## 2022-01-11 ENCOUNTER — Encounter: Payer: Self-pay | Admitting: Emergency Medicine

## 2022-01-11 ENCOUNTER — Emergency Department (INDEPENDENT_AMBULATORY_CARE_PROVIDER_SITE_OTHER): Payer: Medicare HMO

## 2022-01-11 DIAGNOSIS — R062 Wheezing: Secondary | ICD-10-CM | POA: Diagnosis not present

## 2022-01-11 DIAGNOSIS — R0609 Other forms of dyspnea: Secondary | ICD-10-CM

## 2022-01-11 DIAGNOSIS — R0600 Dyspnea, unspecified: Secondary | ICD-10-CM

## 2022-01-11 MED ORDER — AMOXICILLIN 250 MG PO CAPS: 250.0000 mg | ORAL_CAPSULE | Freq: Two times a day (BID) | ORAL | 0 refills | Status: DC

## 2022-01-11 NOTE — Discharge Instructions (Signed)
Continue albuterol inhaler as prescribed. ?Avoid excessive fluid and salt intake. ? ?If symptoms become significantly worse during the night or over the weekend, proceed to the local emergency room.  ?

## 2022-01-11 NOTE — ED Triage Notes (Addendum)
SOB  x 2 days w/ wheezing  ?Sinus congestion  ?Min relief w/ inhaler  ?Denies fever or cough ?BLE edema ?

## 2022-01-11 NOTE — ED Provider Notes (Signed)
?[facility] ? ? ? ?CSN: [id number] ?Arrival date & time: 01/11/22  1723 ? ? ?  ? ?History   ?Chief Complaint ?Chief Complaint  ?Patient presents with  ? Shortness of Breath  ?  X 2 days  ? ? ?HPI ?Jody Burton is a 76 y.o. female.  ? ?Patient complains of increased shortness of breath, chest tightness, and wheezing with activity for about a week.  Her symptoms improve after using her albuterol inhaler, although it has not been as effective as in the past.  She notes that she continues to have ankle swelling during the day that resolves during the night.  She feels well otherwise, denying chest pain and fevers, chills, and sweats. ? ?The history is provided by the patient.  ? ?Past Medical History:  ?Diagnosis Date  ? Arthritis   ? Atrial fibrillation ([place])   ? Diabetes mellitus without complication ([place])   ? Gout   ? Hypertension   ? Lupus ([place])   ? ? ?There are no problems to display for this patient. ? ? ?Past Surgical History:  ?Procedure Laterality Date  ? ABDOMINAL HYSTERECTOMY    ? CHOLECYSTECTOMY    ? LUNG SURGERY    ? ? ?OB History   ?No obstetric history on file. ?  ? ? ? ?Home Medications   ? ?Prior to Admission medications   ?Medication Sig Start Date End Date Taking? Authorizing Provider  ?amoxicillin (AMOXIL) 250 MG capsule Take 1 capsule (250 mg total) by mouth 2 (two) times daily. 01/11/22  Yes Kandra Nicolas, MD  ?ferrous sulfate 325 (65 FE) MG tablet Take 1 tablet (324 mg total) by mouth daily with breakfast 07/23/21  Yes [provider]  ?HYDROcodone-acetaminophen (NORCO/VICODIN) 5-325 MG tablet Take by mouth. 05/19/14 02/07/22 Yes [provider]  ?meclizine (ANTIVERT) 25 MG tablet Take by mouth. 02/22/09  Yes [provider]  ?metoprolol succinate (TOPROL-XL) 50 MG 24 hr tablet Take one tablet (50 mg dose) by mouth at bedtime. 12/24/21  Yes [provider]  ?potassium chloride SA (KLOR-CON M) 20 MEQ tablet Take by mouth. 02/27/21  Yes [provider]  ?torsemide (DEMADEX) 100 MG tablet Take by mouth. 10/23/21  Yes [provider]  ?albuterol (VENTOLIN HFA) 108 (90 Base) MCG/ACT inhaler Inhale 1-2 puffs into the lungs every 6 (six) hours as needed for wheezing or shortness of breath. 01/05/20   Noe Gens, PA-C  ?allopurinol (ZYLOPRIM) 100 MG tablet Take 100 mg by mouth daily.    [provider]  ?aspirin 81 MG tablet Take 81 mg by mouth daily. ?Patient not taking: Reported on 01/11/2022    [provider]  ?carvedilol (COREG) 3.125 MG tablet Take 3.125 mg by mouth 2 (two) times daily with a meal. ?Patient not taking: Reported on 01/11/2022    [provider]  ?diltiazem (CARDIZEM) 30 MG tablet Take 30 mg by mouth 2 (two) times daily. 11/12/21   [provider]  ?furosemide (LASIX) 20 MG tablet Take 20 mg by mouth. ?Patient not taking: Reported on 01/11/2022    [provider]  ?glimepiride (AMARYL) 4 MG tablet Take 4 mg by mouth daily with breakfast. ?Patient not taking: Reported on 01/11/2022    [provider]  ?hydroxychloroquine (PLAQUENIL) 200 MG tablet Take 200 mg by mouth 2 (two) times daily. ?Patient not taking: Reported on 01/11/2022    [provider]  ?losartan (COZAAR) 100 MG tablet Take 100 mg by mouth daily. ?Patient not  taking: Reported on 01/11/2022    [provider]  ?metFORMIN (GLUCOPHAGE) 1000 MG tablet Take 1,000 mg by mouth daily with breakfast. ?Patient not taking: Reported on 01/11/2022    [provider]  ?sulfamethoxazole-trimethoprim (BACTRIM DS) 800-160 MG tablet Take 1 tablet by mouth 2 (two) times daily. ?Patient not taking: Reported on 01/11/2022 09/07/19   Fransico Meadow, PA-C  ? ? ?Family History ?Family History  ?Problem Relation Age of Onset  ? Hypertension Mother   ? Heart failure Mother   ? Stroke Mother   ? COPD Father   ? Heart failure Father   ? ? ?Social History ?Social History  ? ?Tobacco Use  ? Smoking status: Never  ?  Smokeless tobacco: Never  ?Vaping Use  ? Vaping Use: Never used  ?Substance Use Topics  ? Alcohol use: No  ? Drug use: No  ? ? ? ?Allergies   ?Patient has no known allergies. ? ? ?Review of Systems ?Review of Systems ?No sore throat ?No cough ?No pleuritic pain ?+ wheezing ?+ nasal congestion ?No post-nasal drainage ?No sinus pain/pressure ?No itchy/red eyes ?No earache ?No hemoptysis ?+ SOB ?No fever/chills ?No nausea ?No vomiting ?No abdominal pain ?No diarrhea ?No urinary symptoms ?No skin rash ?+ fatigue ?No myalgias ?+ ankle edema ?No headache  ? ?Physical Exam ?Triage Vital Signs ?ED Triage Vitals  ?Enc Vitals Group  ?   BP 01/11/22 1734 (!) 142/76  ?   Pulse Rate 01/11/22 1734 78  ?   Resp 01/11/22 1734 20  ?   Temp 01/11/22 1734 98.7 ?F (37.1 ?C)  ?   Temp Source 01/11/22 1734 Oral  ?   SpO2 01/11/22 1734 94 %  ?   Weight 01/11/22 1736 227 lb (103 kg)  ?   Height 01/11/22 1736 5\' 3"  (1.6 m)  ?   Head Circumference --   ?   Peak Flow --   ?   Pain Score 01/11/22 1736 0  ?   Pain Loc --   ?   Pain Edu? --   ?   Excl. in [place]? --   ? ?No data found. ? ?Updated Vital Signs ?BP (!) 142/76 (BP Location: Left Arm)   Pulse 78   Temp 98.7 ?F (37.1 ?C) (Oral)   Resp 20   Ht 5\' 3"  (1.6 m)   Wt 103 kg   SpO2 94%   BMI 40.21 kg/m?  ? ?Visual Acuity ?Right Eye Distance:   ?Left Eye Distance:   ?Bilateral Distance:   ? ?Right Eye Near:   ?Left Eye Near:    ?Bilateral Near:    ? ?Physical Exam ?Nursing notes and Vital Signs reviewed. ?Appearance:  Patient appears stated age, and in no acute distress ?Eyes:  Pupils are equal, round, and reactive to light and accomodation.  Extraocular movement is intact.  Conjunctivae are not inflamed  ?Ears:  Canals normal.  Tympanic membranes normal.  ?Nose:  Mildly congested turbinates.  No sinus tenderness.  ?Pharynx:  Normal ?Neck:  Supple.  No adenopathy.  ?Lungs:  Clear to auscultation.  Breath sounds are equal.  Moving air well. ?Heart:  Regular rate and rhythm without murmurs,  rubs, or gallops.  ?Abdomen:  Nontender without masses or hepatosplenomegaly.  Bowel sounds are present.  No CVA or flank tenderness.  ?Extremities:  Trace bilateral ankle edema. ?Skin:  No rash present.  ? ?UC Treatments / Results  ?Labs ?(all labs ordered are listed, but only abnormal results are  displayed) ?Labs Reviewed - No data to display ? ?EKG ? ? ?Radiology ?DG Chest 2 View ? ?Result Date: 01/11/2022 ?CLINICAL DATA:  Dyspnea and wheezing. EXAM: CHEST - 2 VIEW COMPARISON:  Chest x-ray 01/05/2020. FINDINGS: The heart is mildly enlarged, unchanged. There is some increased central interstitial markings bilaterally. There is no focal lung consolidation, pleural effusion or pneumothorax identified. No acute fractures are seen. IMPRESSION: 1. Increased central interstitial markings may represent mild edema or infection/inflammation. 2. No focal lung consolidation. 3. Stable mild cardiomegaly. Electronically Signed   By: Ronney Asters M.D.   On: 01/11/2022 18:09   ? ?Procedures ?Procedures (including critical care time) ? ?Medications Ordered in UC ?Medications - No data to display ? ?Initial Impression / Assessment and Plan / UC Course  ?I have reviewed the triage vital signs and the nursing notes. ? ?Pertinent labs & imaging results that were available during my care of the patient were reviewed by me and considered in my medical decision making (see chart for details). ? ?  ?Review of chart records reveals weight gain 0.8kg since 12/05/21.  The increased central interstitial markings on chest x-ray may represent infection/inflammation rather than exacerbation CHF. ?Will begin low renal dose amoxicillin 250mg  BID.  Patient states that she has a follow-up visit with her PCP later this month. ? ?Final Clinical Impressions(s) / UC Diagnoses  ? ?Final diagnoses:  ?Dyspnea on exertion  ? ? ? ?Discharge Instructions   ? ?  ?Continue albuterol inhaler as prescribed. ?Avoid excessive fluid and salt intake. ? ?If symptoms  become significantly worse during the night or over the weekend, proceed to the local emergency room.  ? ? ? ? ?ED Prescriptions   ? ? Medication Sig Dispense Auth. Provider  ? amoxicillin (AMOXIL) 250 MG capsule Take 1

## 2022-04-28 IMAGING — DX DG CHEST 2V
2 series · 2 of 2 positions shown · non-contrast
Comparison: Chest x-ray 01/05/2020.

CLINICAL DATA: Dyspnea and wheezing.

EXAM:
CHEST - 2 VIEW

[chest pa]
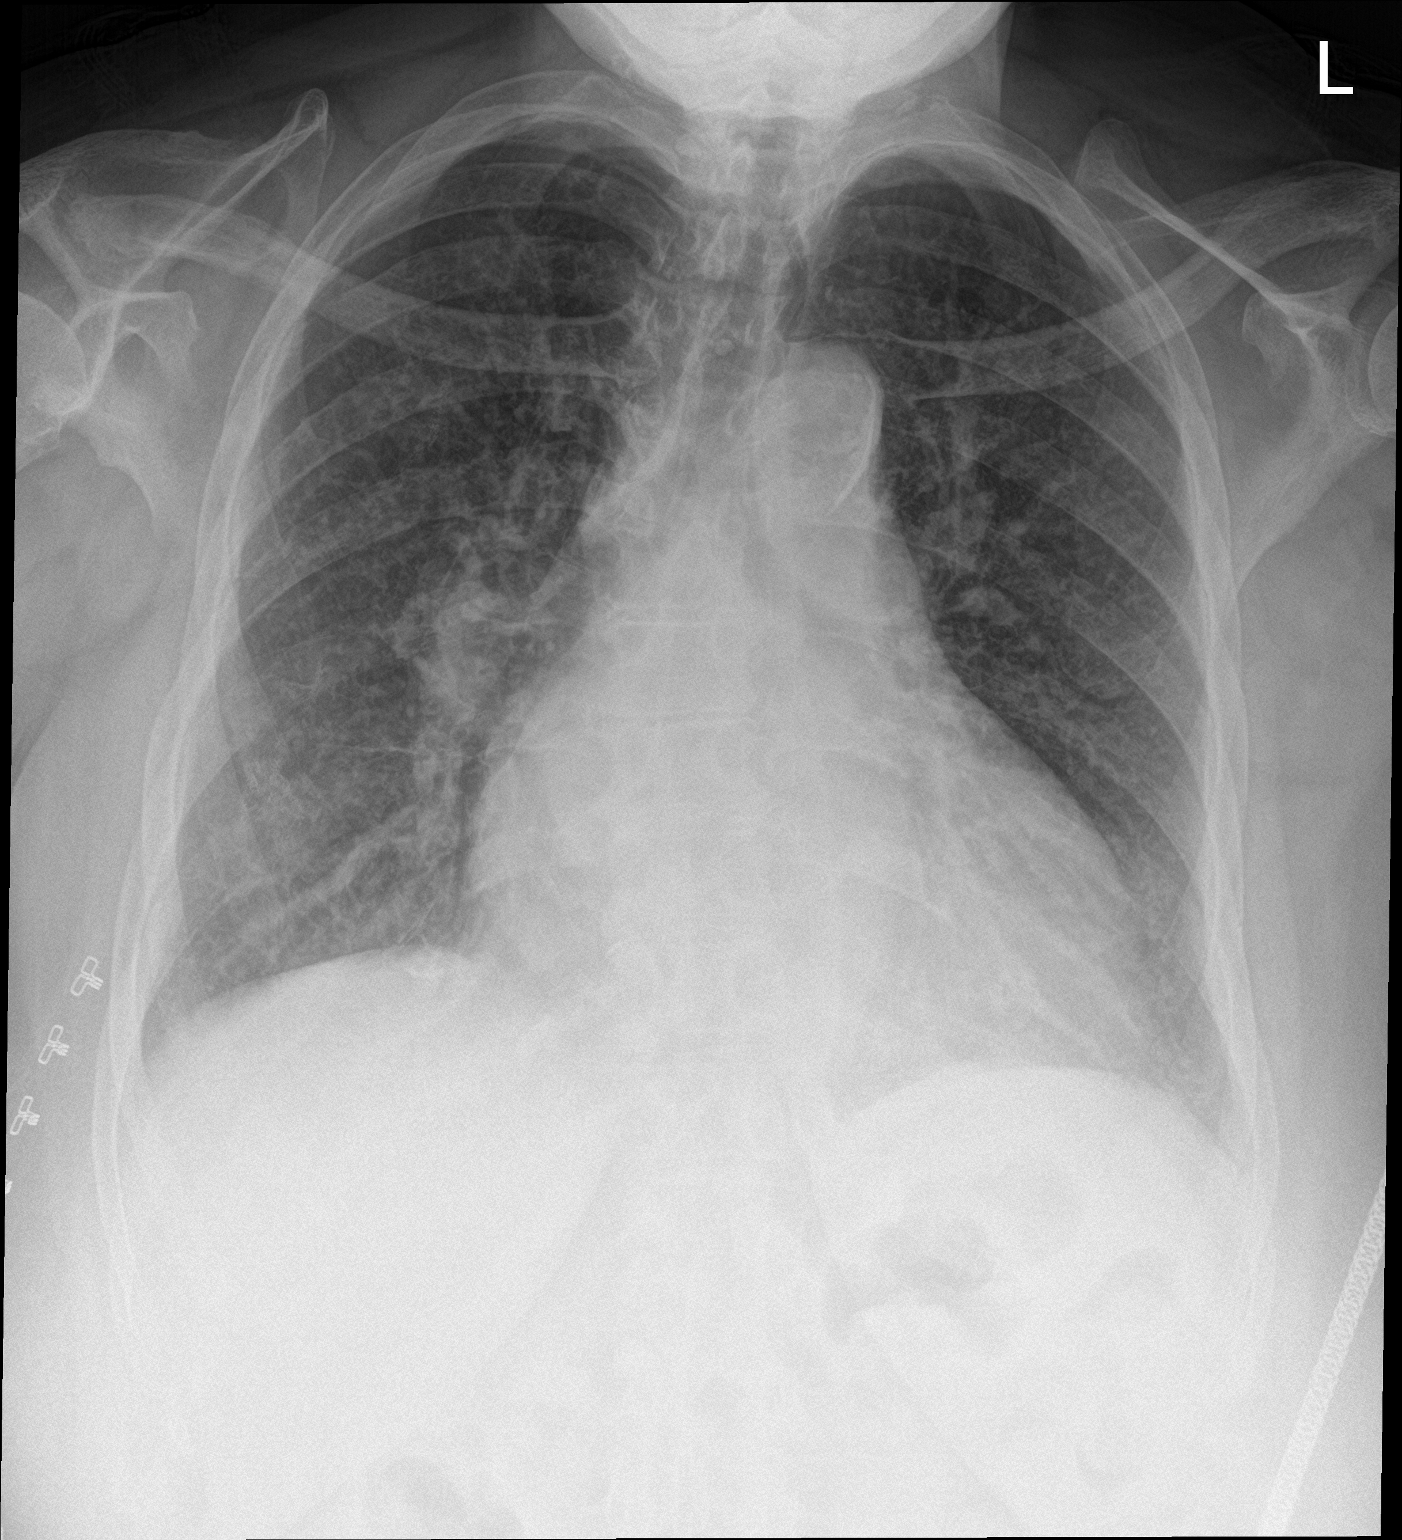

[chest lat]
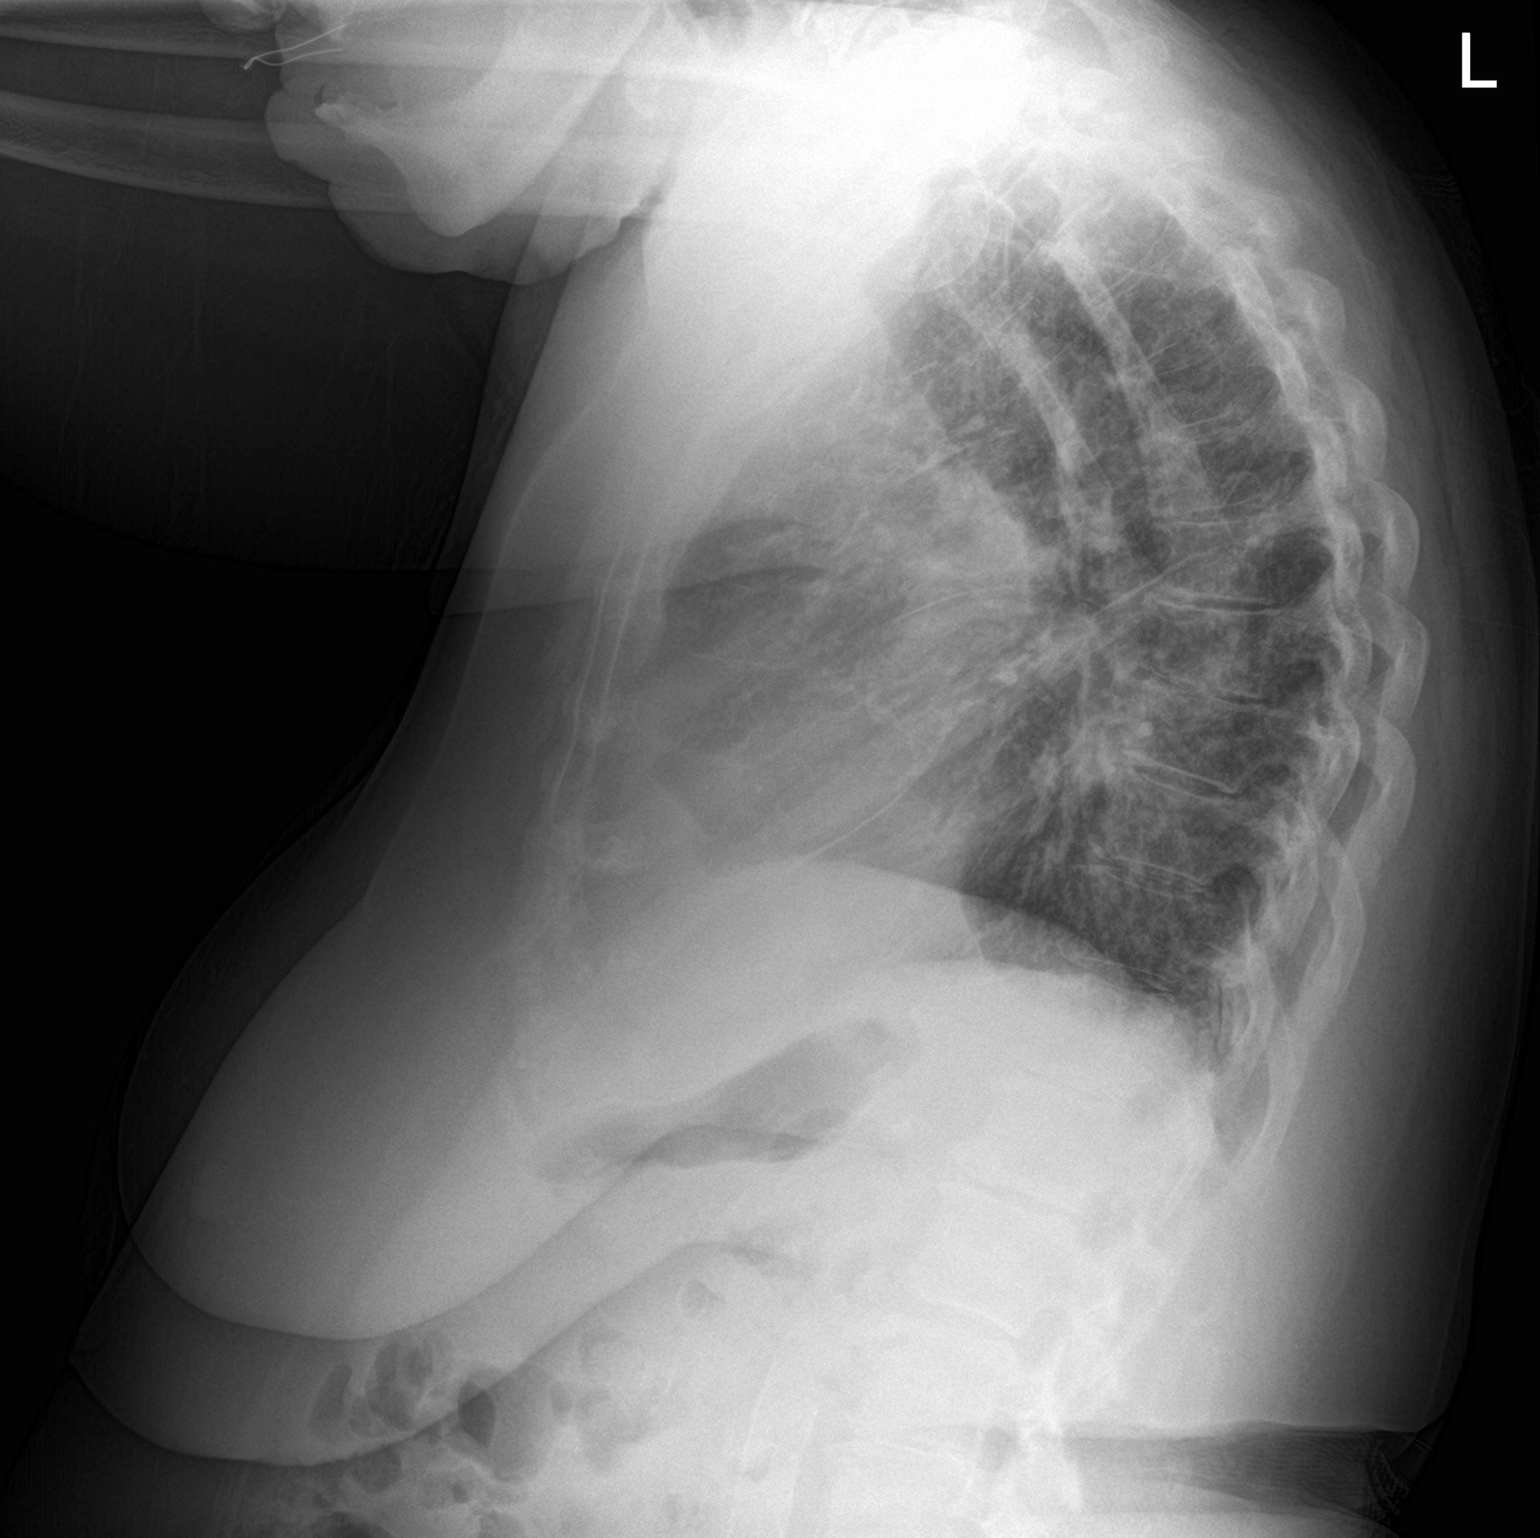

[2 of 2 positions shown; findings below may reference images not displayed]

FINDINGS: The heart is mildly enlarged, unchanged. There is some increased
central interstitial markings bilaterally. There is no focal lung
consolidation, pleural effusion or pneumothorax identified. No acute
fractures are seen.
IMPRESSION: 1. Increased central interstitial markings may represent mild edema
or infection/inflammation.
2. No focal lung consolidation.
3. Stable mild cardiomegaly.

## 2022-09-20 ENCOUNTER — Encounter: Payer: Self-pay | Admitting: Emergency Medicine

## 2022-09-20 ENCOUNTER — Ambulatory Visit
Admission: EM | Admit: 2022-09-20 | Discharge: 2022-09-20 | Disposition: A | Discharge status: Home / Self Care | Payer: Medicare HMO | Attending: Urgent Care | Admitting: Urgent Care

## 2022-09-20 DIAGNOSIS — J40 Bronchitis, not specified as acute or chronic: Secondary | ICD-10-CM

## 2022-09-20 MED ORDER — ALBUTEROL SULFATE (2.5 MG/3ML) 0.083% IN NEBU
2.5000 mg | INHALATION_SOLUTION | Freq: Four times a day (QID) | RESPIRATORY_TRACT | 0 refills | Status: AC | PRN
Start: 2022-09-20 — End: ?

## 2022-09-20 MED ORDER — ALBUTEROL SULFATE (2.5 MG/3ML) 0.083% IN NEBU
2.5000 mg | INHALATION_SOLUTION | Freq: Once | RESPIRATORY_TRACT | Status: AC
Start: 2022-09-20 — End: 2022-09-20
  Administered 2022-09-20: 2.5 mg via RESPIRATORY_TRACT

## 2022-09-20 MED ORDER — PREDNISONE 20 MG PO TABS: 20.0000 mg | ORAL_TABLET | Freq: Every day | ORAL | 0 refills | Status: AC

## 2022-09-20 MED ORDER — FLUTICASONE PROPIONATE 50 MCG/ACT NA SUSP: 1.0000 | Freq: Every day | NASAL | 0 refills | Status: AC

## 2022-09-20 MED ORDER — ALBUTEROL SULFATE (2.5 MG/3ML) 0.083% IN NEBU: 2.5000 mg | INHALATION_SOLUTION | Freq: Four times a day (QID) | RESPIRATORY_TRACT | 12 refills | Status: DC | PRN

## 2022-09-20 NOTE — Discharge Instructions (Addendum)
You have bronchitis. This is usually viral.   Use Flonase daily to help with inflammation of the nasal passage. It is also recommended that you use nasal saline/ sinus washes to cleans the sinus passages. Hot steam from a shower or vaporizer may also be beneficial to help open up the upper airway. Eucalyptus can be helpful.  You were given a nebulizer machine today. Please use the vials every 4-6 hours for the next 3 days, then use as needed. Use this in place of your hand-held albuterol.  Take the prednisone every morning with breakfast until gone. Monitor your sugar levels while taking.  If any worsening symptoms such as headache, fever, or shortness of breath, please return for recheck.

## 2022-09-20 NOTE — ED Triage Notes (Signed)
Patient c/o possible sinus infection, cough, nasal drainage, facial pain x 5 days.  Patient denies any OTC meds.

## 2022-09-20 NOTE — ED Provider Notes (Signed)
[facility]    CSN: [id number] Arrival date & time: 09/20/22  1331      History   Chief Complaint Chief Complaint  Patient presents with   Possible Sinus Infection    HPI Jody Burton is a 76 y.o. female.   Pleasant 76 year old female presents today with concern of a possible sinus infection.  She reports significant nasal congestion and postnasal drainage.  She reports a rather persistent dry cough over the past 5 days as well.  She states she feels tight in the chest.  She does have a handheld inhaler at home, used it this morning without significant improvement.  She denies a fever. Has hx of CHF, denies PND, edema, weight gain.      Past Medical History:  Diagnosis Date   Arthritis    Atrial fibrillation (HCC)    Diabetes mellitus without complication (HCC)    Gout    Hypertension    Lupus (HCC)     There are no problems to display for this patient.   Past Surgical History:  Procedure Laterality Date   ABDOMINAL HYSTERECTOMY     CHOLECYSTECTOMY     LUNG SURGERY      OB History   No obstetric history on file.      Home Medications    Prior to Admission medications   Medication Sig Start Date End Date Taking? Authorizing Provider  albuterol (VENTOLIN HFA) 108 (90 Base) MCG/ACT inhaler Inhale 1-2 puffs into the lungs every 6 (six) hours as needed for wheezing or shortness of breath. 01/05/20  Yes Phelps, Vangie Bicker, PA-C  allopurinol (ZYLOPRIM) 100 MG tablet Take 100 mg by mouth daily.   Yes [provider]  diltiazem (CARDIZEM) 30 MG tablet Take 30 mg by mouth 2 (two) times daily. 11/12/21  Yes [provider]  ferrous sulfate 325 (65 FE) MG tablet Take 1 tablet (324 mg total) by mouth daily with breakfast 07/23/21  Yes [provider]  fluticasone (FLONASE) 50 MCG/ACT nasal spray Place 1 spray into both nostrils daily. 09/20/22  Yes Breeanne Oblinger L, PA  meclizine (ANTIVERT) 25 MG tablet Take by mouth. 02/22/09  Yes  [provider]  metoprolol succinate (TOPROL-XL) 50 MG 24 hr tablet Take one tablet (50 mg dose) by mouth at bedtime. 12/24/21  Yes [provider]  potassium chloride SA (KLOR-CON M) 20 MEQ tablet Take by mouth. 02/27/21  Yes [provider]  predniSONE (DELTASONE) 20 MG tablet Take 1 tablet (20 mg total) by mouth daily with breakfast for 5 days. 09/20/22 09/25/22 Yes Lamika Connolly L, PA  torsemide (DEMADEX) 100 MG tablet Take by mouth. 10/23/21  Yes [provider]  albuterol (PROVENTIL) (2.5 MG/3ML) 0.083% nebulizer solution Take 3 mLs (2.5 mg total) by nebulization every 6 (six) hours as needed for wheezing or shortness of breath. 09/20/22   Maretta Bees, PA    Family History Family History  Problem Relation Age of Onset   Hypertension Mother    Heart failure Mother    Stroke Mother    COPD Father    Heart failure Father     Social History Social History   Tobacco Use   Smoking status: Never   Smokeless tobacco: Never  Vaping Use   Vaping Use: Never used  Substance Use Topics   Alcohol use: No   Drug use: No     Allergies   Patient has no known allergies.   Review of Systems Review of Systems  As per HPI  Physical Exam Triage Vital Signs ED Triage Vitals  Enc Vitals Group     BP 09/20/22 1448 (!) 164/89     Pulse Rate 09/20/22 1448 89     Resp 09/20/22 1448 18     Temp 09/20/22 1448 98.6 F (37 C)     Temp Source 09/20/22 1448 Oral     SpO2 09/20/22 1448 94 %     Weight 09/20/22 1452 224 lb (101.6 kg)     Height 09/20/22 1452 5\' 2"  (1.575 m)     Head Circumference --      Peak Flow --      Pain Score 09/20/22 1451 0     Pain Loc --      Pain Edu? --      Excl. in GC? --    No data found.  Updated Vital Signs BP (!) 164/89 (BP Location: Right Arm)   Pulse 89   Temp 98.6 F (37 C) (Oral)   Resp 18   Ht 5\' 2"  (1.575 m)   Wt 224 lb (101.6 kg)   SpO2 94%   BMI 40.97 kg/m   Visual Acuity Right Eye  Distance:   Left Eye Distance:   Bilateral Distance:    Right Eye Near:   Left Eye Near:    Bilateral Near:     Physical Exam Vitals and nursing note reviewed.  Constitutional:      General: She is not in acute distress.    Appearance: She is well-developed. She is obese. She is not ill-appearing, toxic-appearing or diaphoretic.  HENT:     Head: Normocephalic and atraumatic.     Right Ear: Tympanic membrane, ear canal and external ear normal. There is no impacted cerumen.     Left Ear: Tympanic membrane, ear canal and external ear normal. There is no impacted cerumen.     Nose: Nose normal. No congestion or rhinorrhea.     Mouth/Throat:     Mouth: Mucous membranes are moist.     Pharynx: Oropharynx is clear. No oropharyngeal exudate or posterior oropharyngeal erythema.  Eyes:     General: No scleral icterus.       Right eye: No discharge.        Left eye: No discharge.     Extraocular Movements: Extraocular movements intact.     Conjunctiva/sclera: Conjunctivae normal.     Pupils: Pupils are equal, round, and reactive to light.  Cardiovascular:     Rate and Rhythm: Normal rate and regular rhythm.     Heart sounds: No murmur heard. Pulmonary:     Effort: Pulmonary effort is normal. No accessory muscle usage, respiratory distress or retractions.     Breath sounds: Normal air entry. No stridor, decreased air movement or transmitted upper airway sounds. Wheezing present. No decreased breath sounds, rhonchi or rales.     Comments: Decreased breath sounds noted prior to neb; significant improvement s/p neb tx Abdominal:     Palpations: Abdomen is soft.     Tenderness: There is no abdominal tenderness.  Musculoskeletal:        General: No swelling.     Cervical back: Normal range of motion and neck supple. No rigidity or tenderness.     Right lower leg: Edema present.     Left lower leg: Edema present.     Comments: Equal bilaterally  Lymphadenopathy:     Cervical: No cervical  adenopathy.  Skin:    General: Skin is warm  and dry.     Capillary Refill: Capillary refill takes less than 2 seconds.     Coloration: Skin is not jaundiced.     Findings: No bruising, erythema or rash.  Neurological:     General: No focal deficit present.     Mental Status: She is alert and oriented to person, place, and time.     Sensory: No sensory deficit.     Motor: No weakness.  Psychiatric:        Mood and Affect: Mood normal.        Behavior: Behavior normal.      UC Treatments / Results  Labs (all labs ordered are listed, but only abnormal results are displayed) Labs Reviewed - No data to display  EKG   Radiology No results found.  Procedures Procedures (including critical care time)  Medications Ordered in UC Medications  albuterol (PROVENTIL) (2.5 MG/3ML) 0.083% nebulizer solution 2.5 mg (2.5 mg Nebulization Given 09/20/22 1516)    Initial Impression / Assessment and Plan / UC Course  I have reviewed the triage vital signs and the nursing notes.  Pertinent labs & imaging results that were available during my care of the patient were reviewed by me and considered in my medical decision making (see chart for details).     Viral bronchitis -question possible RSV.  Due to national shortage, unable to test for this.  Patient does have risk factors including chronic cardiopulmonary disease.  Patient was given albuterol nebulizer in clinic with significant improvement to her symptoms.  Patient was therefore given a nebulizer machine, and discharged with instructions to use the nebulizer solution every 4-6 hours around-the-clock for the next 3 days.  This will be used in place of her handheld inhaler.  Will do a short course of 20 mg prednisone, patient instructed to monitor her blood sugars while taking this medication.  Flonase to help with URI symptoms.  Return precautions reviewed.  Final Clinical Impressions(s) / UC Diagnoses   Final diagnoses:  Bronchitis      Discharge Instructions      You have bronchitis. This is usually viral.   Use Flonase daily to help with inflammation of the nasal passage. It is also recommended that you use nasal saline/ sinus washes to cleans the sinus passages. Hot steam from a shower or vaporizer may also be beneficial to help open up the upper airway. Eucalyptus can be helpful.  You were given a nebulizer machine today. Please use the vials every 4-6 hours for the next 3 days, then use as needed. Use this in place of your hand-held albuterol.  Take the prednisone every morning with breakfast until gone. Monitor your sugar levels while taking.  If any worsening symptoms such as headache, fever, or shortness of breath, please return for recheck.     ED Prescriptions     Medication Sig Dispense Auth. Provider   albuterol (PROVENTIL) (2.5 MG/3ML) 0.083% nebulizer solution  (Status: Discontinued) Take 3 mLs (2.5 mg total) by nebulization every 6 (six) hours as needed for wheezing or shortness of breath. 75 mL Bladimir Auman L, PA   predniSONE (DELTASONE) 20 MG tablet Take 1 tablet (20 mg total) by mouth daily with breakfast for 5 days. 5 tablet Marqui Formby L, PA   fluticasone (FLONASE) 50 MCG/ACT nasal spray Place 1 spray into both nostrils daily. 16 mL Netty Sullivant L, PA   albuterol (PROVENTIL) (2.5 MG/3ML) 0.083% nebulizer solution Take 3 mLs (2.5 mg total) by nebulization every 6 (  six) hours as needed for wheezing or shortness of breath. 75 mL Muntaha Vermette L, PA      PDMP not reviewed this encounter.   Maretta BeesCrain, Montine Hight L, [place] 09/20/22 [phone]
